# Patient Record
Sex: Male | Born: 1990 | Hispanic: No | Marital: Single | Smoking: Former smoker
Health system: Southern US, Community
[De-identification: ages and names within clinical notes are randomized; demographics above are authoritative.]

## PROBLEM LIST (undated history)

## (undated) DIAGNOSIS — A630 Anogenital (venereal) warts: Secondary | ICD-10-CM

## (undated) HISTORY — PX: APPENDECTOMY: SHX54

---

## 2012-08-19 ENCOUNTER — Encounter (HOSPITAL_COMMUNITY): Payer: Self-pay | Admitting: Emergency Medicine

## 2012-08-19 ENCOUNTER — Emergency Department (HOSPITAL_COMMUNITY): Payer: Managed Care, Other (non HMO)

## 2012-08-19 ENCOUNTER — Emergency Department (HOSPITAL_COMMUNITY)
Admission: EM | Admit: 2012-08-19 | Discharge: 2012-08-19 | Disposition: A | Discharge status: Home / Self Care | Payer: Managed Care, Other (non HMO) | Attending: Emergency Medicine | Admitting: Emergency Medicine

## 2012-08-19 DIAGNOSIS — S99911A Unspecified injury of right ankle, initial encounter: Secondary | ICD-10-CM

## 2012-08-19 DIAGNOSIS — S8990XA Unspecified injury of unspecified lower leg, initial encounter: Secondary | ICD-10-CM | POA: Insufficient documentation

## 2012-08-19 DIAGNOSIS — Y9367 Activity, basketball: Secondary | ICD-10-CM | POA: Insufficient documentation

## 2012-08-19 DIAGNOSIS — Y929 Unspecified place or not applicable: Secondary | ICD-10-CM | POA: Insufficient documentation

## 2012-08-19 DIAGNOSIS — X500XXA Overexertion from strenuous movement or load, initial encounter: Secondary | ICD-10-CM | POA: Insufficient documentation

## 2012-08-19 MED ORDER — HYDROCODONE-ACETAMINOPHEN 5-325 MG PO TABS: 1.0000 | ORAL_TABLET | ORAL | Status: DC | PRN

## 2012-08-19 NOTE — ED Provider Notes (Signed)
Medical screening examination/treatment/procedure(s) were performed by non-physician practitioner and as supervising physician I was immediately available for consultation/collaboration.  Donnetta Hutching, MD 08/19/12 [phone]

## 2012-08-19 NOTE — ED Notes (Signed)
Pt c/o r ankle injury. Reports twisting ankle last night while playing basketball. Swelling noted.

## 2012-08-19 NOTE — ED Provider Notes (Signed)
CSN: [id number]     Arrival date & time 08/19/12  1221 History     First MD Initiated Contact with Patient 08/19/12 1341     Chief Complaint  Patient presents with  . Ankle Injury   (Consider location/radiation/quality/duration/timing/severity/associated sxs/prior Treatment) HPI Dennis Bishop is a 21 y.o.male without any significant PMH presents to the ER with complaints of ankle injury. He twisted his ankle playing basketball last night and it continues to hurt. He denies being able to apply pressure on the foot because of pain. It hurts the most on the right side of his right ankle. He denies loss of consciousness or falling and injuring another part of his body. No head pain or neck pain. Knife history of injury to that ankle.    History reviewed. No pertinent past medical history. Past Surgical History  Procedure Laterality Date  . Appendectomy     No family history on file. History  Substance Use Topics  . Smoking status: Never Smoker   . Smokeless tobacco: Not on file  . Alcohol Use: No    Review of Systems ROS is negative unless otherwise stated in the HPI   Allergies  Review of patient's allergies indicates no known allergies.  Home Medications   Current Outpatient Rx  Name  Route  Sig  Dispense  Refill  . HYDROcodone-acetaminophen (NORCO/VICODIN) 5-325 MG per tablet   Oral   Take 1 tablet by mouth every 4 (four) hours as needed for pain.   10 tablet   0    BP 124/73  Pulse 97  Temp(Src) 98.8 F (37.1 C) (Oral)  Resp 14  SpO2 99% Physical Exam  Nursing note and vitals reviewed. Constitutional: He appears well-developed and well-nourished. No distress.  HENT:  Head: Normocephalic and atraumatic.  Eyes: Pupils are equal, round, and reactive to light.  Neck: Normal range of motion. Neck supple.  Cardiovascular: Normal rate and regular rhythm.   Pulmonary/Chest: Effort normal.  Abdominal: Soft.  Musculoskeletal:       Right ankle: He exhibits  decreased range of motion and swelling. He exhibits no ecchymosis, no deformity, no laceration and normal pulse. Tenderness. Lateral malleolus tenderness found. No medial malleolus tenderness found. Achilles tendon normal.  Neurological: He is alert.  Skin: Skin is warm and dry.    ED Course   Procedures (including critical care time)  Labs Reviewed - No data to display Dg Ankle Complete Right  08/19/2012   *RADIOLOGY REPORT*  Clinical Data: Pain post trauma  RIGHT ANKLE - COMPLETE 3+ VIEW  Comparison: None.  Findings: Frontal, oblique, and lateral views were obtained.  There is soft tissue swelling laterally. There is no demonstrable fracture.  There is a small joint effusion. Ankle mortise appears intact.  No erosive change.  IMPRESSION: Swelling laterally with small effusion.  Suspect ligamentous injury.  No fracture apparent.   Original Report Authenticated By: Bretta Bang, M.D.   1. Ankle injury, right, initial encounter     MDM  Is negative for bony injury. However effusion suggests possible ligamentous injury. She will be put and ASO and given crutches. I have advised him to stay off of his ankle for 2 weeks and do some rehabilitation exercises I have given them. If after one to 2 weeks he still unable to put any pressure on his foot and continues to have pain he's been referred to see the orthopedic doctor as he may have more severely damaged the ligament of suspected.  RICE protocol recommended.  21 y.o.Dennis Bishop's evaluation in the Emergency Department is complete. It has been determined that no acute conditions requiring further emergency intervention are present at this time. The patient/guardian have been advised of the diagnosis and plan. We have discussed signs and symptoms that warrant return to the ED, such as changes or worsening in symptoms.  Vital signs are stable at discharge. Filed Vitals:   08/19/12 1248  BP: 124/73  Pulse: 97  Temp: 98.8 F (37.1 C)   Resp: 14    Patient/guardian has voiced understanding and agreed to follow-up with the PCP or specialist.    Dorthula Matas, PA-C 08/19/12 1404

## 2013-05-28 ENCOUNTER — Encounter (HOSPITAL_COMMUNITY): Payer: Self-pay | Admitting: Emergency Medicine

## 2013-05-28 ENCOUNTER — Emergency Department (HOSPITAL_COMMUNITY)
Admission: EM | Admit: 2013-05-28 | Discharge: 2013-05-28 | Disposition: A | Discharge status: Home / Self Care | Payer: Managed Care, Other (non HMO) | Attending: Emergency Medicine | Admitting: Emergency Medicine

## 2013-05-28 DIAGNOSIS — Y9389 Activity, other specified: Secondary | ICD-10-CM | POA: Insufficient documentation

## 2013-05-28 DIAGNOSIS — W5501XA Bitten by cat, initial encounter: Secondary | ICD-10-CM

## 2013-05-28 DIAGNOSIS — IMO0002 Reserved for concepts with insufficient information to code with codable children: Secondary | ICD-10-CM | POA: Insufficient documentation

## 2013-05-28 DIAGNOSIS — Y929 Unspecified place or not applicable: Secondary | ICD-10-CM | POA: Insufficient documentation

## 2013-05-28 DIAGNOSIS — Z23 Encounter for immunization: Secondary | ICD-10-CM | POA: Insufficient documentation

## 2013-05-28 DIAGNOSIS — IMO0001 Reserved for inherently not codable concepts without codable children: Secondary | ICD-10-CM | POA: Insufficient documentation

## 2013-05-28 MED ORDER — AMOXICILLIN-POT CLAVULANATE 875-125 MG PO TABS: 1.0000 | ORAL_TABLET | Freq: Two times a day (BID) | ORAL | Status: DC

## 2013-05-28 MED ORDER — TETANUS-DIPHTH-ACELL PERTUSSIS 5-2.5-18.5 LF-MCG/0.5 IM SUSP
0.5000 mL | Freq: Once | INTRAMUSCULAR | Status: AC
  Administered 2013-05-28: 0.5 mL via INTRAMUSCULAR
  Filled 2013-05-28: qty 0.5

## 2013-05-28 NOTE — ED Notes (Signed)
Pt reports his new cat bite him on the Left index finger last night. No bleeding or redness noted. Denies Pain.

## 2013-05-28 NOTE — ED Provider Notes (Signed)
CSN: [id number]     Arrival date & time 05/28/13  1025 History   First MD Initiated Contact with Patient 05/28/13 1029     Chief Complaint  Patient presents with  . Animal Bite     (Consider location/radiation/quality/duration/timing/severity/associated sxs/prior Treatment) HPI Comments: Pt states that he got a new cat yesterday and while he was feeding her she bit the left index finger and scratched his right lower leg. Pt states that he thinks he finger looked a little swollen today. Pt is unsure of when his last tetanus was. Denies any drainage, redness or warmth to the area. Cat came from a breeder.   The history is provided by the patient. No language interpreter was used.    No past medical history on file. Past Surgical History  Procedure Laterality Date  . Appendectomy     No family history on file. History  Substance Use Topics  . Smoking status: Never Smoker   . Smokeless tobacco: Not on file  . Alcohol Use: No    Review of Systems  Constitutional: Negative.   Respiratory: Negative.   Cardiovascular: Negative.       Allergies  No known allergies  Home Medications   Prior to Admission medications   Not on File   There were no vitals taken for this visit. Physical Exam  Nursing note and vitals reviewed. Constitutional: He appears well-developed and well-nourished.  Cardiovascular: Normal rate and regular rhythm.   Pulmonary/Chest: Effort normal and breath sounds normal.  Neurological: He is alert. Coordination normal.  Skin:  Abrasion noted to the to the right lower leg. No sign of bite marks of infection noted to the left index finger. Pt has full rom. Cap refill <2 secs    ED Course  Procedures (including critical care time) Labs Review Labs Reviewed - No data to display  Imaging Review No results found.   EKG Interpretation None      MDM   Final diagnoses:  Cat bite    Tetanus updated. No infection noted. Will treat with  antibiotics    Teressa Lower, NP 05/28/13 1045

## 2013-05-28 NOTE — ED Provider Notes (Signed)
Medical screening examination/treatment/procedure(s) were performed by non-physician practitioner and as supervising physician I was immediately available for consultation/collaboration.  Roselind Klus L Presly Steinruck, MD 05/28/13 1440 

## 2013-05-28 NOTE — Discharge Instructions (Signed)

## 2013-09-08 ENCOUNTER — Encounter (HOSPITAL_COMMUNITY): Payer: Self-pay | Admitting: Emergency Medicine

## 2013-09-08 ENCOUNTER — Emergency Department (HOSPITAL_COMMUNITY)
Admission: EM | Admit: 2013-09-08 | Discharge: 2013-09-08 | Disposition: A | Discharge status: Home / Self Care | Payer: Managed Care, Other (non HMO) | Attending: Emergency Medicine | Admitting: Emergency Medicine

## 2013-09-08 ENCOUNTER — Emergency Department (HOSPITAL_COMMUNITY): Payer: Managed Care, Other (non HMO)

## 2013-09-08 DIAGNOSIS — Z792 Long term (current) use of antibiotics: Secondary | ICD-10-CM | POA: Diagnosis not present

## 2013-09-08 DIAGNOSIS — R0789 Other chest pain: Secondary | ICD-10-CM | POA: Diagnosis not present

## 2013-09-08 DIAGNOSIS — R079 Chest pain, unspecified: Secondary | ICD-10-CM | POA: Diagnosis present

## 2013-09-08 DIAGNOSIS — Z87891 Personal history of nicotine dependence: Secondary | ICD-10-CM | POA: Diagnosis not present

## 2013-09-08 LAB — BASIC METABOLIC PANEL
Anion gap: 14 (ref 5–15)
BUN: 14 mg/dL (ref 6–23)
CHLORIDE: 103 meq/L (ref 96–112)
CO2: 24 mEq/L (ref 19–32)
Calcium: 9.6 mg/dL (ref 8.4–10.5)
Creatinine, Ser: 1.06 mg/dL (ref 0.50–1.35)
GFR calc Af Amer: >90 mL/min (ref >90)
GFR calc non Af Amer: >90 mL/min (ref >90)
GLUCOSE: 68 mg/dL — AB (ref 70–99)
POTASSIUM: 3.7 meq/L (ref 3.7–5.3)
Sodium: 141 mEq/L (ref 137–147)

## 2013-09-08 LAB — CBC WITH DIFFERENTIAL/PLATELET
Basophils Absolute: 0 10*3/uL (ref 0.0–0.1)
Basophils Relative: 1 % (ref 0–1)
Eosinophils Absolute: 0.4 10*3/uL (ref 0.0–0.7)
Eosinophils Relative: 9 % — ABNORMAL HIGH (ref 0–5)
HCT: 42 % (ref 39.0–52.0)
Hemoglobin: 13.6 g/dL (ref 13.0–17.0)
LYMPHS ABS: 2.1 10*3/uL (ref 0.7–4.0)
Lymphocytes Relative: 48 % — ABNORMAL HIGH (ref 12–46)
MCH: 26.7 pg (ref 26.0–34.0)
MCHC: 32.4 g/dL (ref 30.0–36.0)
MCV: 82.5 fL (ref 78.0–100.0)
MONOS PCT: 12 % (ref 3–12)
Monocytes Absolute: 0.5 10*3/uL (ref 0.1–1.0)
NEUTROS ABS: 1.3 10*3/uL — AB (ref 1.7–7.7)
NEUTROS PCT: 30 % — AB (ref 43–77)
PLATELETS: 215 10*3/uL (ref 150–400)
RBC: 5.09 MIL/uL (ref 4.22–5.81)
RDW: 12.6 % (ref 11.5–15.5)
WBC: 4.3 10*3/uL (ref 4.0–10.5)

## 2013-09-08 LAB — SEDIMENTATION RATE: Sed Rate: 2 mm/hr (ref 0–16)

## 2013-09-08 LAB — TROPONIN I

## 2013-09-08 MED ORDER — GI COCKTAIL ~~LOC~~
30.0000 mL | Freq: Once | ORAL | Status: AC
  Administered 2013-09-08: 30 mL via ORAL
  Filled 2013-09-08: qty 30

## 2013-09-08 MED ORDER — RANITIDINE HCL 150 MG PO TABS: 150.0000 mg | ORAL_TABLET | Freq: Two times a day (BID) | ORAL | Status: DC

## 2013-09-08 NOTE — ED Notes (Signed)
Urinal given to pt per request.

## 2013-09-08 NOTE — ED Provider Notes (Signed)
CSN: [id number]     Arrival date & time 09/08/13  0745 History   First MD Initiated Contact with Patient 09/08/13 0750     Chief Complaint  Patient presents with  . Chest Pain     (Consider location/radiation/quality/duration/timing/severity/associated sxs/prior Treatment) Patient is a 23 y.o. male presenting with chest pain. The history is provided by the patient.  Chest Pain Pain location:  Substernal area Pain quality: sharp   Pain radiates to:  Does not radiate Pain radiates to the back: no   Pain severity:  Mild Onset quality:  Sudden Duration: up to 15 seconds. Timing:  Intermittent Progression since onset: becoming more frequent. Chronicity:  New Context: at rest   Relieved by: possibly by sitting up. Worsened by:  Nothing tried Ineffective treatments:  None tried Associated symptoms: no abdominal pain, no cough, no fever, no headache, no nausea, no numbness, no shortness of breath and not vomiting     History reviewed. No pertinent past medical history. Past Surgical History  Procedure Laterality Date  . Appendectomy     History reviewed. No pertinent family history. History  Substance Use Topics  . Smoking status: Former Games developer  . Smokeless tobacco: Not on file  . Alcohol Use: Yes     Comment: occ    Review of Systems  Constitutional: Negative for fever.  HENT: Negative for drooling and rhinorrhea.   Eyes: Negative for pain.  Respiratory: Negative for cough and shortness of breath.   Cardiovascular: Positive for chest pain. Negative for leg swelling.  Gastrointestinal: Negative for nausea, vomiting, abdominal pain and diarrhea.  Genitourinary: Negative for dysuria and hematuria.  Musculoskeletal: Negative for gait problem and neck pain.  Skin: Negative for color change.  Neurological: Negative for numbness and headaches.  Hematological: Negative for adenopathy.  Psychiatric/Behavioral: Negative for behavioral problems.  All other systems reviewed and  are negative.     Allergies  Review of patient's allergies indicates no known allergies.  Home Medications   Prior to Admission medications   Medication Sig Start Date End Date Taking? Authorizing Provider  amoxicillin-clavulanate (AUGMENTIN) 875-125 MG per tablet Take 1 tablet by mouth every 12 (twelve) hours. 05/28/13   Teressa Lower, NP   BP 144/69  Pulse 63  Temp(Src) 97.6 F (36.4 C) (Oral)  Resp 22  SpO2 100% Physical Exam  Nursing note and vitals reviewed. Constitutional: He is oriented to person, place, and time. He appears well-developed and well-nourished.  HENT:  Head: Normocephalic and atraumatic.  Right Ear: External ear normal.  Left Ear: External ear normal.  Nose: Nose normal.  Mouth/Throat: Oropharynx is clear and moist. No oropharyngeal exudate.  Eyes: Conjunctivae and EOM are normal. Pupils are equal, round, and reactive to light.  Neck: Normal range of motion. Neck supple.  Cardiovascular: Normal rate, regular rhythm, normal heart sounds and intact distal pulses.  Exam reveals no gallop and no friction rub.   No murmur heard. No friction rub heard.   Pulmonary/Chest: Effort normal and breath sounds normal. No respiratory distress. He has no wheezes.  Abdominal: Soft. Bowel sounds are normal. He exhibits no distension. There is no tenderness. There is no rebound and no guarding.  Musculoskeletal: Normal range of motion. He exhibits no edema and no tenderness.  Neurological: He is alert and oriented to person, place, and time.  Skin: Skin is warm and dry.  Psychiatric: He has a normal mood and affect. His behavior is normal.    ED Course  Procedures (including critical care  time) Labs Review Labs Reviewed  CBC WITH DIFFERENTIAL - Abnormal; Notable for the following:    Neutrophils Relative % 30 (*)    Neutro Abs 1.3 (*)    Lymphocytes Relative 48 (*)    Eosinophils Relative 9 (*)    All other components within normal limits  BASIC METABOLIC  PANEL - Abnormal; Notable for the following:    Glucose, Bld 68 (*)    All other components within normal limits  SEDIMENTATION RATE  TROPONIN I    Imaging Review Dg Chest 2 View  09/08/2013   CLINICAL DATA:  Chest pain for 1 week.  EXAM: CHEST  2 VIEW  COMPARISON:  None.  FINDINGS: The heart size and mediastinal contours are within normal limits. Both lungs are clear. The visualized skeletal structures are unremarkable.  IMPRESSION: No active cardiopulmonary disease.   Electronically Signed   By: Richarda Overlie M.D.   On: 09/08/2013 08:40     EKG Interpretation   Date/Time:  Wednesday September 08 2013 07:53:31 EDT Ventricular Rate:  64 PR Interval:  138 QRS Duration: 102 QT Interval:  408 QTC Calculation: 421 R Axis:   80 Text Interpretation:  Sinus or ectopic atrial rhythm ST elevation suggests  acute pericarditis no previous for comparison Confirmed by Destina Mantei  MD,  Vickee Mormino ([id number]) on 09/08/2013 8:26:16 AM      MDM   Final diagnoses:  Other chest pain    8:01 AM 23 y.o. male who presents with intermittent sharp chest pain becoming more frequent over the last week. He denies any pain currently on exam and denies any shortness of breath or pleuritic-type pain. He believes his pain is worse in the morning and after drinking liquids in the morning. He notes that it is not exertional. He believes it may be relieved to some degree by sitting up. He is afebrile and vital signs are unremarkable here. He denies any fevers or other associated symptoms at home. His EKG shows some mild diffuse ST elevation which is slightly suspicious for pericarditis. He has no friction rub on exam. I performed a bedside ultrasound which showed only very mild trace pericardial effusion which is likely physiologic. He is otherwise well-appearing on exam. Will get screening labs and imaging. Differential also includes esophageal reflux. Will give a GI cocktail.  10:01 AM: I interpreted/reviewed the labs and/or  imaging which were non-contributory.  Pt continues to appear well. Doubt pericarditis. Pt notes sx worse after eating a spicy meal recently. Likely reflux. Will have him return for any worsening. I have discussed the diagnosis/risks/treatment options with the patient and believe the pt to be eligible for discharge home to follow-up with and estab w/ a pcp as needed. We also discussed returning to the ED immediately if new or worsening sx occur. We discussed the sx which are most concerning (e.g., worsening pain, fever, sob) that necessitate immediate return. Medications administered to the patient during their visit and any new prescriptions provided to the patient are listed below.  Medications given during this visit Medications  gi cocktail (Maalox,Lidocaine,Donnatal) (30 mLs Oral Given 09/08/13 0821)    New Prescriptions   RANITIDINE (ZANTAC) 150 MG TABLET    Take 1 tablet (150 mg total) by mouth 2 (two) times daily.       Purvis Sheffield, MD 09/08/13 1003

## 2013-09-08 NOTE — ED Notes (Addendum)
Pt c/o increasing, intermittent central chest pain x 1 week.  Pain score 2/10.  Pt denies other symptoms.  Sts pain increases w/ "drinking water."   Hx of smoking.  NAD noted.

## 2013-11-04 ENCOUNTER — Emergency Department (HOSPITAL_COMMUNITY)
Admission: EM | Admit: 2013-11-04 | Discharge: 2013-11-04 | Disposition: A | Discharge status: Home / Self Care | Payer: Managed Care, Other (non HMO) | Attending: Emergency Medicine | Admitting: Emergency Medicine

## 2013-11-04 ENCOUNTER — Encounter (HOSPITAL_COMMUNITY): Payer: Self-pay | Admitting: Emergency Medicine

## 2013-11-04 DIAGNOSIS — Z87891 Personal history of nicotine dependence: Secondary | ICD-10-CM | POA: Diagnosis not present

## 2013-11-04 DIAGNOSIS — R05 Cough: Secondary | ICD-10-CM | POA: Diagnosis not present

## 2013-11-04 DIAGNOSIS — R350 Frequency of micturition: Secondary | ICD-10-CM | POA: Insufficient documentation

## 2013-11-04 DIAGNOSIS — R21 Rash and other nonspecific skin eruption: Secondary | ICD-10-CM | POA: Diagnosis not present

## 2013-11-04 DIAGNOSIS — Z202 Contact with and (suspected) exposure to infections with a predominantly sexual mode of transmission: Secondary | ICD-10-CM | POA: Diagnosis present

## 2013-11-04 DIAGNOSIS — J3489 Other specified disorders of nose and nasal sinuses: Secondary | ICD-10-CM | POA: Diagnosis not present

## 2013-11-04 NOTE — ED Provider Notes (Signed)
CSN: [id number]     Arrival date & time 11/04/13  1547 History  This chart was scribed for non-physician practitioner, Wynetta EmeryNicole Bela Nyborg, PA, working with No att. providers found by Marica OtterNusrat Rahman, ED Scribe. This patient was seen in room WTR6/WTR6 and the patient's care was started at [facility]6:42 PM.    Chief Complaint  Patient presents with  . SEXUALLY TRANSMITTED DISEASE   The history is provided by the patient. No language interpreter was used.   HPI Comments: PCP: No PCP Per Patient  Othman Jerolyn Shinlkhamees is a 23 y.o. male, with medical hx noted below, who presents to the Emergency Department with suspected contraction of an STD. Patient complains of white spots on his testicles and shaft; and frequency. Pt denies any penile pain or discharge. Pt reports his last sexual encounter was three weeks ago with a male and reports using protection. Pt denies fever, abd pain, dsyuria. Pt notes, however, that he is recovering from a flu and has mild rhinorrhea and cough.   History reviewed. No pertinent past medical history. Past Surgical History  Procedure Laterality Date  . Appendectomy     History reviewed. No pertinent family history. History  Substance Use Topics  . Smoking status: Former Smoker -- 1.00 packs/day for 5 years    Types: Cigarettes  . Smokeless tobacco: Not on file  . Alcohol Use: Yes     Comment: occ    Review of Systems  Constitutional: Negative for fever and chills.  HENT: Positive for rhinorrhea.   Respiratory: Positive for cough.   Genitourinary: Positive for frequency. Negative for dysuria, discharge, difficulty urinating, penile pain and testicular pain.  Skin: Positive for color change (white spots on testicles. ).  Psychiatric/Behavioral: Negative for confusion.  All other systems reviewed and are negative.  Allergies  Review of patient's allergies indicates no known allergies.  Home Medications   Prior to Admission medications   Not on File   Triage Vitals:  BP 145/73  Pulse 93  Temp(Src) 97.8 F (36.6 C) (Oral)  Resp 16  SpO2 100%  Physical Exam  Nursing note and vitals reviewed. Constitutional: He is oriented to person, place, and time. He appears well-developed and well-nourished. No distress.  HENT:  Head: Normocephalic and atraumatic.  Eyes: EOM are normal.  Neck: Neck supple. No tracheal deviation present.  Cardiovascular: Normal rate.   Pulmonary/Chest: Effort normal. No respiratory distress.  Genitourinary: Penis normal. No penile tenderness.  Milia like rash along the dorsal side of the penile shaft. No erythema, tenderness or discharge.   Musculoskeletal: Normal range of motion.  Neurological: He is alert and oriented to person, place, and time.  Skin: Skin is warm and dry.  Psychiatric: He has a normal mood and affect. His behavior is normal.    ED Course  Procedures (including critical care time) DIAGNOSTIC STUDIES: Oxygen Saturation is 100% on RA, nl by my interpretation.    COORDINATION OF CARE: 6:47 PM- Discussed treatment plan which includes dermatology referral with patient at bedside and patient agreed to plan.   Labs Review Labs Reviewed - No data to display  Imaging Review No results found.   EKG Interpretation None      MDM   Final diagnoses:  Rash and nonspecific skin eruption    Filed Vitals:   11/04/13 1558 11/04/13 1909  BP: 145/73 138/71  Pulse: 93 64  Temp: 97.8 F (36.6 C) 97.8 F (36.6 C)  TempSrc: Oral   Resp: 16 16  SpO2: 100% 100%  Shreyan Jerolyn Shinlkhamees is a 23 y.o. male presenting with rash to penile shaft, non-concerning for syphilis, herpes. No systemic signs of infection. I have reassured the patient and offered STD testing which patient declines. We'll give him a referral to dermatology.  Evaluation does not show pathology that would require ongoing emergent intervention or inpatient treatment. Pt is hemodynamically stable and mentating appropriately. Discussed findings and  plan with patient/guardian, who agrees with care plan. All questions answered. Return precautions discussed and outpatient follow up given.    I personally performed the services described in this documentation, which was scribed in my presence. The recorded information has been reviewed and is accurate.    Wynetta Emeryicole Briannah Lona, PA-C 11/04/13 1942

## 2013-11-04 NOTE — Discharge Instructions (Signed)
Do not hesitate to return to the emergency room for any new, worsening or concerning symptoms. ° °Please obtain primary care using resource guide below. But the minute you were seen in the emergency room and that they will need to obtain records for further outpatient management. ° ° ° °Emergency Department Resource Guide °1) Find a Doctor and Pay Out of Pocket °Although you won't have to find out who is covered by your insurance plan, it is a good idea to ask around and get recommendations. You will then need to call the office and see if the doctor you have chosen will accept you as a new patient and what types of options they offer for patients who are self-pay. Some doctors offer discounts or will set up payment plans for their patients who do not have insurance, but you will need to ask so you aren't surprised when you get to your appointment. ° °2) Contact Your Local Health Department °Not all health departments have doctors that can see patients for sick visits, but many do, so it is worth a call to see if yours does. If you don't know where your local health department is, you can check in your phone book. The CDC also has a tool to help you locate your state's health department, and many state websites also have listings of all of their local health departments. ° °3) Find a Walk-in Clinic °If your illness is not likely to be very severe or complicated, you may want to try a walk in clinic. These are popping up all over the country in pharmacies, drugstores, and shopping centers. They're usually staffed by nurse practitioners or physician assistants that have been trained to treat common illnesses and complaints. They're usually fairly quick and inexpensive. However, if you have serious medical issues or chronic medical problems, these are probably not your best option. ° °No Primary Care Doctor: °- Call [facility] at  832-8000 - they can help you locate a primary care doctor that  accepts your  insurance, provides certain services, etc. °- Physician Referral Service- 1-800-533-3463 ° °Chronic Pain Problems: °Organization         Address  Phone   Notes  °[facility]  (336) 297-2271 Patients need to be referred by their primary care doctor.  ° °Medication Assistance: °Organization         Address  Phone   Notes  °[place] [facility] [address] °[place], [place] [address] (336) 641-8030 --Must be a resident of [place] °-- Must have NO insurance coverage whatsoever (no Medicaid/ Medicare, etc.) °-- The pt. MUST have a primary care doctor that directs their care regularly and follows them in the community °  °[facility]  (866) 331-1348   °[employer]  (888) 892-1162   ° °Agencies that provide inexpensive medical care: °Organization         Address  Phone   Notes  °[facility]  (336) 832-8035   °[facility]    (336) 832-7272   °[facility] [address] °[place], [place] [address] (336) 832-4777   °[facility] [address], °[place] (336) 271-4999   °[facility]    (336) 373-0678   °[facility]    (336) 272-1050   °[facility] ° [address], [place] Phone:  (336) 832-4444, Fax:  (336) 832-4440 Hours of Operation:  9 am -   6 pm, M-F.  Also accepts Medicaid/Medicare and self-pay.  °[facility] ° [address], [place] Phone: (336) 832-3150, Fax: (336) 832-3151. Hours of Operation:  8:30 am - 5:30 pm, M-F.  Also accepts Medicaid and self-pay.  °[facility] [place] [address], [place] Phone: (336) 878-6027   °[facility] [address], [place], [place] (336)723-1848, Ext. 123 Mondays & Thursdays: 7-9 AM.  First 15 patients are seen on a first come, first serve basis. °  ° °Medicaid-accepting [place] Providers: ° °Organization          Address  Phone   Notes  °[facility] [address], [place] (336) 641-2100 Also accepts self-pay patients.  °[facility] [address], [place] ° (336) 856-9996   °[facility] [address], [place] (336) 288-8857   °[facility] [address] [place] [address], [place] (336) 299-7000   °Veita Bland [address], [place]  ° (336) 373-1557 Only accepts [place] Access Medicaid patients after they have their name applied to their card.  ° °Self-Pay (no insurance) in [place]: ° °Organization         Address  Phone   Notes  °Sickle Cell Patients, [facility] [address], [place] (336) 832-1970   °[facility] [address], [place] (336) 832-4400   °[facility] ° [address], [place] (336) 992-4800   °[facility]/Dr. Osei-Bonsu ° [address] [place] [address], [place] or [address], [place] (336) 841-8500 Phone number for both [place] and [place] locations is the same.  °[facility] [address], [place] (336) 299-0000   °[facility] [address] [place] [address], [place] or [address] (336) 852-7530 °(336) 878-2260   °[facility] [address], [place] (336) 350-1642, phone; (336) 294-5005, fax Sees patients 1st and 3rd Saturday of every month.  Must not qualify for public or private insurance (i.e. Medicaid, Medicare, [place] Health Choice, Veterans' Benefits) • Household income should be no more than 200% of the poverty level •The clinic cannot treat you if you are pregnant or think you are pregnant • Sexually transmitted diseases are not treated at the clinic.  ° ° °Dental Care: °Organization         Address  Phone  Notes  °[facility] [address],   (336) 641-6152 Accepts children up to age 21 who are enrolled in Medicaid or [place] Health Choice; pregnant women with a Medicaid card; and children who have applied for Medicaid or [place] Health Choice, but were declined, whose parents can pay a reduced fee at time of service.  °[facility] [place]  [address], [place] (336) 641-7733 Accepts children up to age 21 who are enrolled in Medicaid or [place] Health Choice; pregnant women with a Medicaid card; and children who have applied for Medicaid or  Health Choice, but were declined, whose parents can pay a reduced fee at time of service.  °[facility] ° [address],  (336) 641-4533 Patients are seen by appointment only. Walk-ins are not accepted. [facility] will see patients 18 years of age and   older. °Monday - Tuesday (8am-5pm) °Most Wednesdays (8:30-5pm) °$30 per visit, cash only  °[facility] ° [address], [place] (336) 641-4533 Patients are seen by appointment only. Walk-ins are not accepted. [facility] will see patients 18 years of age and older. °One Wednesday Evening (Monthly: Volunteer Based).  $30 per visit, cash only  °[facility]  (919) 537-3737 for adults; Children under age 4, call Graduate Pediatric Dentistry at (919) [phone]. Children aged 4-14, please call (919) 537-3737 to request a pediatric application. ° Dental services are provided in all areas of dental care including fillings, crowns and bridges, complete and partial dentures, implants, gum treatment, root canals, and extractions. Preventive care is also provided. Treatment is provided to both adults and children. °Patients are selected via a lottery and there is often a waiting list. °  °[facility] [address], °[place] ° (336) 763-8833 [email or web address] °  °[facility] [address], [place], [place]  (336)723-1848, Ext. 123 Second and Fourth Thursday of each month, opens at 6:30 AM; Clinic ends at 9 AM.  Patients are seen on a first-come first-served basis, and a limited number are seen during each clinic.  ° °[facility] ° [address], [place], [place] (336) 723-7904   Eligibility Requirements °You must have lived in [place], [place], or [place] counties for at least the last three months. °  You cannot be eligible for state or federal sponsored healthcare insurance, including Veterans Administration, Medicaid, or Medicare. °  You generally cannot be eligible for healthcare insurance through your employer.  °  How to apply: °Eligibility screenings are held every Tuesday and Wednesday afternoon from 1:00 pm until 4:00 pm. You do not need an appointment for the interview!  °[facility] [address], [place], [place] 336-631-2330   °[facility]  336-342-8273   °[facility]  336-703-3100   °[facility]  336-570-6415   ° °Behavioral Health Resources in the Community: °Intensive Outpatient Programs °Organization         Address  Phone  Notes  °[facility] [address], [place], [place] 336-878-6098   °[facility] [address], [place], [place] 336-832-9800   °[facility] [address], [place], [place] ° 336-882-2125   °[facility] [address],  °[place], [place] 1-800-853-5163 or 336-641-4981   °Substance Abuse Resources °Organization         Address  Phone  Notes  °[facility]  336-882-2125   °[facility]  336-784-9470   °[facility]  336-285-9073   °[facility]  336-845-3988   °Residential & Outpatient Substance Abuse Program  1-800-659-3381   °Psychological Services °Organization         Address  Phone  Notes  °[facility]  336- 832-9600   °[facility]  336- 378-7881    °[facility] [address], [place] 1-800-853-5163 or 336-641-4981   ° °Mobile Crisis Teams °Organization         Address  Phone  Notes  °[facility], Mobile Crisis Care Unit  1-877-626-1772   °Assertive °[facility] ° [address] [place], [place] 336-834-9664   °Sharon DeEsch [address] °[place] 336-554-5454   ° °Self-Help/Support Groups °Organization         Address    Phone             Notes  °[facility] - variety of support groups  336- 373-1402 Call for more information  °Narcotics Anonymous (NA), [facility] [address], °[place]  2 meetings at this location  ° °Residential Treatment Programs °Organization         Address  Phone  Notes  °[facility] [address],    °[place]  1-866-801-8205   °[facility] ° [address], [place], [place] 704-293-8524   °[facility] [address], [place] 336-845-3988 Admissions: 8am-3pm M-F  °[facility] [address]    °[place], [place] 336-841-1104   °[facility] [address], [place], [place] 336-379-7146   °[facility] [address]  °[place], [place] 336-285-9073   °[facility] Intensive Outpatient [address], [place], [place] 336-852-3033   °[facility] [address]  °[place], [place] 1-877-615-2722 or 336-784-9470   °[facility] [address] [place], [place] 336-227-7417 Accepts Medicaid  °[facility] [address]  °[place] 1-800-659-3381 Substance Abuse/Addiction Treatment  ° °[place] Behavioral Health Resources °Organization         Address  Phone  Notes  °[facility]  (888) 581-9988   °Julie Brannon, PhD [address] [place], [place]   (336) 349-5553 or (336) 951-0000   °[facility]   [address] °[place], [place] (336) 349-4454   °[facility] [address], [place], [place] (336) 342-8316 Insurance/Medicaid/sponsorship through [facility]  °[facility] [address]                                    [place], [place] (336) 342-8316 Therapy/tele-psych/case  °[facility] [address]  ° [place], [place] (336) 349-2233    °Dr. Arfeen  (336) 349-4544   °[facility] [place]  [employer] [place] [facility] 1) [address], [place] °2) [address], [place] °3)  [address], [place] (336) 349-3220 °(336) 342-7768 ° °(336) 342-8140   °[place] [facility] (336) 342-1394 or (336) 342-3537 (After Hours)    ° ° ° °

## 2013-11-04 NOTE — ED Notes (Signed)
Pt reports bumps on his penis and white spots on his testicles x4 days. Denies penile discharge. Denies pain.

## 2013-11-06 NOTE — ED Provider Notes (Signed)
Medical screening examination/treatment/procedure(s) were performed by non-physician practitioner and as supervising physician I was immediately available for consultation/collaboration.   EKG Interpretation None        Dennis SkeensJoshua M Marigny Borre, MD 11/06/13 1121

## 2013-11-21 ENCOUNTER — Encounter (HOSPITAL_COMMUNITY): Payer: Self-pay

## 2013-11-21 ENCOUNTER — Emergency Department (HOSPITAL_COMMUNITY)
Admission: EM | Admit: 2013-11-21 | Discharge: 2013-11-21 | Disposition: A | Discharge status: Home / Self Care | Payer: Managed Care, Other (non HMO) | Attending: Emergency Medicine | Admitting: Emergency Medicine

## 2013-11-21 DIAGNOSIS — Z87891 Personal history of nicotine dependence: Secondary | ICD-10-CM | POA: Diagnosis not present

## 2013-11-21 DIAGNOSIS — R21 Rash and other nonspecific skin eruption: Secondary | ICD-10-CM | POA: Insufficient documentation

## 2013-11-21 NOTE — ED Provider Notes (Signed)
CSN: [id number]     Arrival date & time 11/21/13  [id number] History   First MD Initiated Contact with Patient 11/21/13 [phone]     Chief Complaint  Patient presents with  . Rash     (Consider location/radiation/quality/duration/timing/severity/associated sxs/prior Treatment) HPI Dennis Bishop is a 23 year old malewith no past medical history who presents to the ER with a rash in his genitals. Patient states he was seen here several weeks ago, and the provider noted that he had a rash on his scrotum which appeared benign. Patient states since then he has now noticed the same type of rash on his shaft and head of his penis. Patient reports the lesions are asymptomatic, do not itch, burn and are nonpainful. Patient denies penile discharge, dysuria, abdominal pain. Patient states his last sexual contact was approximately one month ago, and he has not had any sexual contact since then.  History reviewed. No pertinent past medical history. Past Surgical History  Procedure Laterality Date  . Appendectomy     No family history on file. History  Substance Use Topics  . Smoking status: Former Smoker -- 1.00 packs/day for 5 years    Types: Cigarettes  . Smokeless tobacco: Not on file  . Alcohol Use: Yes     Comment: occ    Review of Systems  Constitutional: Negative for fever and chills.  Skin: Positive for rash.      Allergies  Review of patient's allergies indicates no known allergies.  Home Medications   Prior to Admission medications   Not on File   BP 132/47 mmHg  Pulse 59  Temp(Src) 97.8 F (36.6 C) (Oral)  Resp 16  SpO2 100% Physical Exam  Constitutional: He is oriented to person, place, and time. He appears well-developed and well-nourished. No distress.  HENT:  Head: Normocephalic and atraumatic.  Eyes: Right eye exhibits no discharge. Left eye exhibits no discharge. No scleral icterus.  Neck: Normal range of motion.  Pulmonary/Chest: Effort normal. No respiratory  distress.  Genitourinary: Testes normal. Cremasteric reflex is present. Right testis shows no mass, no swelling and no tenderness. Left testis shows no mass, no swelling and no tenderness. Circumcised. No penile erythema or penile tenderness. No discharge found.  Diffuse maculopapular, skin tone, flat, well-circumscribed lesions noted diffusely patient's scrotum, shaft of his penis, and head of his penis. No penile discharge, tenderness. No open sores or vesicular lesions. No chancre.   Musculoskeletal: Normal range of motion.  Neurological: He is alert and oriented to person, place, and time.  Skin: Skin is warm and dry. He is not diaphoretic.  Psychiatric: He has a normal mood and affect.  Nursing note and vitals reviewed.   ED Course  Procedures (including critical care time) Labs Review Labs Reviewed - No data to display  Imaging Review No results found.   EKG Interpretation None      MDM   Final diagnoses:  Rash and nonspecific skin eruption    Painless, asymptomatic multiple maculopapular skin toned bumps noted on exam consistent with possible genital warts. Patient was encouraged to follow with dermatology last time,which he states he was unable to follow-up with them. Since the rash has progressed, I strongly encouraged patient to follow-up this time with dermatology. I offered further STD testing including GC/chlamydia, HIV, syphilis which patient denied today. I discussed return precautions with patient, and encourage him to call or return to the ER should he have any questions or concerns.  BP 132/47 mmHg  Pulse 59  Temp(Src) 97.8 F (36.6 C) (Oral)  Resp 16  SpO2 100%  Signed,  Ladona MowJoe Braidyn Peace, PA-C 11:05 AM  Patient discussed with Dr. Rochele RaringKristen Ward, M.D.  Monte FantasiaJoseph W Alyxis Grippi, PA-C 11/21/13 1105  Layla MawKristen N Ward, DO 11/21/13 [phone]

## 2013-11-21 NOTE — Discharge Instructions (Signed)
Follow-up with dermatology. Return to the ER if your symptoms worsen.   Genital Warts Genital warts are a sexually transmitted infection. They may appear as small bumps on the tissues of the genital area. CAUSES  Genital warts are caused by a virus called human papillomavirus (HPV). HPV is the most common sexually transmitted disease (STD) and infection of the sex organs. This infection is spread by having unprotected sex with an infected person. It can be spread by vaginal, anal, and oral sex. Many people do not know they are infected. They may be infected for years without problems. However, even if they do not have problems, they can unknowingly pass the infection to their sexual partners. SYMPTOMS   Itching and irritation in the genital area.  Warts that bleed.  Painful sexual intercourse. DIAGNOSIS  Warts are usually recognized with the naked eye on the vagina, vulva, perineum, anus, and rectum. Certain tests can also diagnose genital warts, such as:  A Pap test.  A tissue sample (biopsy) exam.  Colposcopy. A magnifying tool is used to examine the vagina and cervix. The HPV cells will change color when certain solutions are used. TREATMENT  Warts can be removed by:  Applying certain chemicals, such as cantharidin or podophyllin.  Liquid nitrogen freezing (cryotherapy).  Immunotherapy with Candida or Trichophyton injections.  Laser treatment.  Burning with an electrified probe (electrocautery).  Interferon injections.  Surgery. PREVENTION  HPV vaccination can help prevent HPV infections that cause genital warts and that cause cancer of the cervix. It is recommended that the vaccination be given to people between the ages 25 to 36 years old. The vaccine might not work as well or might not work at all if you already have HPV. It should not be given to pregnant women. HOME CARE INSTRUCTIONS   It is important to follow your caregiver's instructions. The warts will not go away  without treatment. Repeat treatments are often needed to get rid of warts. Even after it appears that the warts are gone, the normal tissue underneath often remains infected.  Do not try to treat genital warts with medicine used to treat hand warts. This type of medicine is strong and can burn the skin in the genital area, causing more damage.  Tell your past and current sexual partner(s) that you have genital warts. They may be infected also and need treatment.  Avoid sexual contact while being treated.  Do not touch or scratch the warts. The infection may spread to other parts of your body.  Women with genital warts should have a cervical cancer check (Pap test) at least once a year. This type of cancer is slow-growing and can be cured if found early. Chances of developing cervical cancer are increased with HPV.  Inform your obstetrician about your warts in the event of pregnancy. This virus can be passed to the baby's respiratory tract. Discuss this with your caregiver.  Use a condom during sexual intercourse. Following treatment, the use of condoms will help prevent reinfection.  Ask your caregiver about using over-the-counter anti-itch creams. SEEK MEDICAL CARE IF:   Your treated skin becomes red, swollen, or painful.  You have a fever.  You feel generally ill.  You feel little lumps in and around your genital area.  You are bleeding or have painful sexual intercourse. MAKE SURE YOU:   Understand these instructions.  Will watch your condition.  Will get help right away if you are not doing well or get worse. Document Released: 12/22/1999  Document Revised: 05/10/2013 Document Reviewed: 07/02/2010 [facility] Patient Information 2015 [place], [place]. This information is not intended to replace advice given to you by your health care provider. Make sure you discuss any questions you have with your health care provider.   Emergency Department Resource Guide 1) Find a Doctor and  Pay Out of Pocket Although you won't have to find out who is covered by your insurance plan, it is a good idea to ask around and get recommendations. You will then need to call the office and see if the doctor you have chosen will accept you as a new patient and what types of options they offer for patients who are self-pay. Some doctors offer discounts or will set up payment plans for their patients who do not have insurance, but you will need to ask so you aren't surprised when you get to your appointment.  2) Contact Your Local Health Department Not all health departments have doctors that can see patients for sick visits, but many do, so it is worth a call to see if yours does. If you don't know where your local health department is, you can check in your phone book. The CDC also has a tool to help you locate your state's health department, and many state websites also have listings of all of their local health departments.  3) Find a Walk-in Clinic If your illness is not likely to be very severe or complicated, you may want to try a walk in clinic. These are popping up all over the country in pharmacies, drugstores, and shopping centers. They're usually staffed by nurse practitioners or physician assistants that have been trained to treat common illnesses and complaints. They're usually fairly quick and inexpensive. However, if you have serious medical issues or chronic medical problems, these are probably not your best option.  No Primary Care Doctor: - Call Health Connect at  [phone] - they can help you locate a primary care doctor that  accepts your insurance, provides certain services, etc. - Physician Referral Service- [phone]  Chronic Pain Problems: Organization         Address  Phone   Notes  [facility]  [phone] Patients need to be referred by their primary care doctor.   Medication Assistance: Organization         Address  Phone   Notes  [facility] Medication [facility] [address] [place], [place] [address] [phone] --Must be a resident of [facility] -- Must have NO insurance coverage whatsoever (no Medicaid/ Medicare, etc.) -- The pt. MUST have a primary care doctor that directs their care regularly and follows them in the community   MedAssist  [phone]   [employer]  [phone]    Agencies that provide inexpensive medical care: Organization         Address  Phone   Notes  [facility]  [phone]   [facility]    [phone]) [phone]   [facility] [address] [place], [place] [address] [phone]   [facility] [address], [place] [phone]) [phone]   [facility]anned [facility]    [phone]) [phone]   [facility]    [phone]) [phone]   [facility]  [address], [place] Phone:  [phone], Fax:  [phone] Hours of Operation:  9 am - 6 pm, M-F.  Also accepts  Medicaid/Medicare and self-pay.  [facility]  [address], [place] Phone: [phone], Fax: [phone]. Hours of Operation:  8:30 am - 5:30 pm, M-F.  Also accepts Medicaid and self-pay.  [facility] [place] [address] [place] Phone: [phone]   [facility] [address] Natasha Bence [place], [place] [phone], Ext. [phone] Mondays & Thursdays: 7-9 AM.  First 15 patients are seen on a first come, first serve basis.    Medicaid-accepting [facility] Providers:  Organization         Address  Phone   Notes  [facility] [address], [place] [phone] Also accepts self-pay patients.  [facility] [address] Laurell Josephs [place], [place]  [phone]   [facility] [address], [place] [phone]   [facility] [address], [place] [phone]   Renaye Rakers [address], [place]   [phone] Only accepts [place] patients after they have their name applied to their card.   Self-Pay (no insurance) in [facility]:  Organization         Address  Phone   Notes  Sickle Cell Patients, [facility] [address] [place], [place] [phone]   [facility] [address] [place], [place] [phone]   [facility] [place]  [address], [place] [phone]   [facility]/Dr. Osei-Bonsu  [address] [place] or [address], [place] [phone] Phone number for both [place] and [place] locations is the same.  [facility] and [facility] [address], [place] [phone]   [facility] [address], [place] or [address] [phone]   [facility] [address], [place] [phone], phone; [phone], fax Sees patients 1st and 3rd Saturday of every month.  Must not qualify for public or private insurance (i.e. Medicaid, Medicare, [place] Health Choice, Veterans' Benefits)  Household income should be no more than 200% of the poverty level The clinic cannot treat you if you are pregnant or think you are pregnant  Sexually transmitted diseases are not treated at the clinic.    Dental Care: Organization         Address  Phone  Notes  [facility] of [facility] [address] [place], [place] [phone] Accepts children up to age 51 who are enrolled in [place] or [place] Health Choice; pregnant women with a Medicaid card; and children who have applied for Medicaid or [place] Health Choice, but were declined, whose parents can pay a reduced fee at time of service.  [facility] Department of [facility]  [address], [place] [phone] Accepts children up to age 10 who are enrolled in [place] or [place] Health Choice; pregnant women with a Medicaid card; and children who have applied for Medicaid or  Health Choice, but were declined, whose parents can pay a reduced fee at time of service.  [facility]  [address] [place], [place] [phone] Patients are seen by appointment only. Walk-ins are not accepted. [facility] will see patients 71 years of age and older. Monday - Tuesday (8am-5pm) Most  Wednesdays (8:30-5pm) $30 per visit, cash only  [facility]  [address], [facility] [phone] Patients are seen by appointment only. Walk-ins are not accepted. [facility] will see patients 21 years of age and older. One Wednesday Evening (Monthly: Volunteer Based).  $30 per visit, cash only  [facility]  [phone] for adults; Children under age 274, call Graduate Pediatric Dentistry at [phone]. Children aged 644-14, please call [phone] to request a pediatric application.  Dental services are provided in all areas of dental care including fillings, crowns and bridges, complete and partial dentures, implants, gum treatment, root canals, and extractions. Preventive care is also provided. Treatment is provided to both adults and children. Patients are selected via a lottery and there is often a waiting list.   [facility] [address], [place]  [phone]) [phone] [email or web address]   [facility] [address], [place], [place] [phone], Ext. [phone] Second and Fourth Thursday of each month, opens at 6:30 AM; Clinic ends at 9 AM.  Patients are seen on a first-come first-served basis, and a limited number are seen during each clinic.   [facility]  [address], [place], [place] [phone]) [phone]   Eligibility Requirements You must have lived in [place], [place], or  [place] counties for at least the last three months.   You cannot be eligible for state or federal sponsored National Cityhealthcare insurance, including CIGNAVeterans Administration, [place]icaid, or [employer] EntertainmentMedicare.   You generally cannot be eligible for healthcare insurance through your employer.    How to apply: Eligibility screenings are held every Tuesday and Wednesday afternoon from 1:00 pm until 4:00 pm. You do not need an appointment for the interview!  [facility] [address], [place], [place] [phone]   [facility]  [phone]   [facility]  [phone]   [facility]  [phone]    Behavioral Health Resources in the Community: Intensive Outpatient Programs Organization         Address  Phone  Notes  [facility] [address], [place], [place] [phone]   [facility] Outpatient [address], [place], [place] [phone]   [facility] [address], [place], [place]  [phone]   [facility] [address],  [place], [place] [phone] or [phone]   Substance Abuse Resources Organization         Address  Phone  Notes  [facility]  [phone]   [facility]  [phone]   [facility]  [phone]   [facility]  [phone]   Residential & Outpatient Substance Abuse Program  [phone]   Psychological Services Organization         Address  Phone  Notes  [facility]  [phone]- [phone]   [facility]  [phone]- [phone]   [facility] [address], [place] [phone] or [phone]    Mobile Crisis Teams Organization         Address  Phone  Notes  [facility], Mobile Crisis Care Unit  [phone]   [facility]  [address] [place], [place] [phone]   Doristine LocksSharon DeEsch [address] [place] [phone]    Self-Help/Support Groups Organization         Address  Phone  Notes  [facility] [place] - variety of support groups  336- I7437963 Call for more information  Narcotics Anonymous (NA), [facility]aring [facility] [address], [employer] [place]  2 meetings at this location   Statistician         Address  Phone  Notes  [facility] [address],    [place]  [phone]   [facility]  [address], [place] [address], [place], [place] [phone]   [facility] [address] [place], [place] [phone] Admissions: 8am-3pm M-F  [facility] [address],    [place], [place] [phone]   [facility] [address] [place], [place], [place] [phone]   [facility] [address]  [place], [place] [phone]   [facility] - Intensive Outpatient [address] Laurell Josephs [address], [place], [place] [phone]   [facility] ([facility] [address] [place].,  [place], [place] [phone] or [phone]   [facility] ([facility]) [address], [place], [place] [phone] Accepts Medicaid  [facility] [address],  [place] [phone] Substance Abuse/Addiction Treatment   [facility] Organization         Address  Phone  Notes  [facility]  [phone]   Angie Fava, PhD [address] Ervin Knack [place], [place]   [phone] or [phone]   [facility]   [address] [place], [place] [phone]   [facility] [address] [place], [place] [phone] Insurance/Medicaid/sponsorship through [facility] and Families [address]                                    [place], [place] [phone] Therapy/tele-psych/case  [facility] [address], [place] [phone]     Dr. Lolly Mustache  [phone]   [facility]  [employer] [facility] Dept. 1) [address], [place] 2) [address] [place] 3)  [address], [place] [phone]  [phone]   [facility] Child Abuse Hotline (928)798-9541 or [phone] (After Hours)

## 2013-11-21 NOTE — ED Notes (Signed)
He states he has a "rash at my privates I think may be genital warts".  He is healthy-looking and in no distress.

## 2013-11-21 NOTE — ED Notes (Signed)
PA at bedside.  Bumping rash present to testicles and penis. Pt reports it does not hurt or itch.

## 2013-11-21 NOTE — ED Notes (Signed)
Pt ambulatory upon DC. Pt was advised to follow up with dermatology as soon as possible for further evaluation. Pt verbalizes the understanding to follow up.

## 2013-11-27 ENCOUNTER — Encounter (HOSPITAL_COMMUNITY): Payer: Self-pay

## 2013-11-27 ENCOUNTER — Emergency Department (HOSPITAL_COMMUNITY)
Admission: EM | Admit: 2013-11-27 | Discharge: 2013-11-27 | Disposition: A | Discharge status: Home / Self Care | Payer: Managed Care, Other (non HMO) | Attending: Emergency Medicine | Admitting: Emergency Medicine

## 2013-11-27 DIAGNOSIS — Z87891 Personal history of nicotine dependence: Secondary | ICD-10-CM | POA: Insufficient documentation

## 2013-11-27 DIAGNOSIS — A630 Anogenital (venereal) warts: Secondary | ICD-10-CM | POA: Insufficient documentation

## 2013-11-27 DIAGNOSIS — R21 Rash and other nonspecific skin eruption: Secondary | ICD-10-CM | POA: Insufficient documentation

## 2013-11-27 HISTORY — DX: Anogenital (venereal) warts: A63.0

## 2013-11-27 NOTE — ED Notes (Signed)
Pt presents with NAD- [facility] Pt has been treated for this. Pt feels he has something other than this DX. MD did not see "blister" at that exam. Pt has no other concerns. Pt attempted to "pop" blister. "Yellow fluid came out" Denies N/V/D. Pt c/o of fever this week

## 2013-11-27 NOTE — Discharge Instructions (Signed)
Rash A rash is a change in the color or feel of your skin. There are many different types of rashes. You may have other problems along with your rash. HOME CARE  Avoid the thing that caused your rash.  Do not scratch your rash.  You may take cools baths to help stop itching.  Only take medicines as told by your doctor.  Keep all doctor visits as told. GET HELP RIGHT AWAY IF:   Your pain, puffiness (swelling), or redness gets worse.  You have a fever.  You have new or severe problems.  You have body aches, watery poop (diarrhea), or you throw up (vomit).  Your rash is not better after 3 days. MAKE SURE YOU:   Understand these instructions.  Will watch your condition.  Will get help right away if you are not doing well or get worse. Document Released: 06/12/2007 Document Revised: 03/18/2011 Document Reviewed: 10/08/2010 ExitCare Patient Information 2015 ExitCare, LLC. This information is not intended to replace advice given to you by your health care provider. Make sure you discuss any questions you have with your health care provider.  

## 2013-11-27 NOTE — ED Notes (Signed)
MD at bedside. EDPA VRINDA PRESENT 

## 2013-11-27 NOTE — ED Provider Notes (Signed)
CSN: [id number]     Arrival date & time 11/27/13  1133 History   First MD Initiated Contact with Patient 11/27/13 1147     Chief Complaint  Patient presents with  . Rash     (Consider location/radiation/quality/duration/timing/severity/associated sxs/prior Treatment) HPI Comments: Pt comes in with complaint of a bump on his penis. Pt states that he was recently treated for warts. Denies pain states that he picked at the area this morning and it may has had a white top. No burning with urination or discharge. Pt concerned about herpes  The history is provided by the patient. No language interpreter was used.    Past Medical History  Diagnosis Date  . Genital warts    Past Surgical History  Procedure Laterality Date  . Appendectomy     No family history on file. History  Substance Use Topics  . Smoking status: Former Smoker -- 1.00 packs/day for 5 years    Types: Cigarettes  . Smokeless tobacco: Not on file  . Alcohol Use: Yes     Comment: occ    Review of Systems  All other systems reviewed and are negative.     Allergies  Review of patient's allergies indicates no known allergies.  Home Medications   Prior to Admission medications   Not on File   BP 140/58 mmHg  Pulse 65  Temp(Src) 98.4 F (36.9 C) (Oral)  Resp 17  Ht 6' (1.829 m)  Wt 150 lb (68.04 kg)  BMI 20.34 kg/m2  SpO2 100% Physical Exam  Constitutional: He is oriented to person, place, and time. He appears well-developed and well-nourished.  Cardiovascular: Normal rate and regular rhythm.   Pulmonary/Chest: Effort normal and breath sounds normal.  Genitourinary:  Warts noted. No other sores noted at this time  Musculoskeletal: Normal range of motion.  Neurological: He is alert and oriented to person, place, and time.  Skin: Skin is warm and dry.  Nursing note reviewed.   ED Course  Procedures (including critical care time) Labs Review Labs Reviewed - No data to display  Imaging  Review No results found.   EKG Interpretation None      MDM   Final diagnoses:  Groin rash    No sign of herpes at this time. Discussed safe sex practices with pt    Teressa LowerVrinda Savannha Welle, NP 11/27/13 1243  Gerhard Munchobert Lockwood, MD 11/27/13 [phone]

## 2013-12-13 ENCOUNTER — Ambulatory Visit: Payer: Managed Care, Other (non HMO) | Admitting: Medical

## 2013-12-13 ENCOUNTER — Telehealth: Payer: Self-pay | Admitting: *Deleted

## 2013-12-13 NOTE — Telephone Encounter (Signed)
Pt did not show for appointment 12/13/2013 at 3:00pm to establish care 

## 2014-05-20 ENCOUNTER — Encounter (HOSPITAL_COMMUNITY): Payer: Self-pay | Admitting: Emergency Medicine

## 2014-05-20 ENCOUNTER — Emergency Department (HOSPITAL_COMMUNITY)
Admission: EM | Admit: 2014-05-20 | Discharge: 2014-05-21 | Disposition: A | Discharge status: Home / Self Care | Payer: PPO | Attending: Emergency Medicine | Admitting: Emergency Medicine

## 2014-05-20 DIAGNOSIS — Z87891 Personal history of nicotine dependence: Secondary | ICD-10-CM | POA: Diagnosis not present

## 2014-05-20 DIAGNOSIS — Z8619 Personal history of other infectious and parasitic diseases: Secondary | ICD-10-CM | POA: Diagnosis not present

## 2014-05-20 DIAGNOSIS — M654 Radial styloid tenosynovitis [de Quervain]: Secondary | ICD-10-CM | POA: Diagnosis not present

## 2014-05-20 DIAGNOSIS — R200 Anesthesia of skin: Secondary | ICD-10-CM | POA: Diagnosis present

## 2014-05-20 MED ORDER — NAPROXEN 375 MG PO TABS
375.0000 mg | ORAL_TABLET | Freq: Two times a day (BID) | ORAL | Status: DC
Start: 2014-05-20 — End: 2014-06-20

## 2014-05-20 MED ORDER — IBUPROFEN 800 MG PO TABS
800.0000 mg | ORAL_TABLET | Freq: Once | ORAL | Status: AC
  Administered 2014-05-20: 800 mg via ORAL
  Filled 2014-05-20: qty 1

## 2014-05-20 NOTE — ED Notes (Signed)
Pt c/o arm (bilateral) numbness and leg (bilateral) tingling starting around 2-3 weeks ago. Says, "when I went to hold a cup yesterday I felt some pressure (pointing to his left forearm). I feel random pressure in my legs and arms." Denies arm or leg injury. Denies facial numbness. Patient appears to have full ROM in all extremities. No other c/c.

## 2014-05-20 NOTE — ED Provider Notes (Signed)
CSN: [id number]     Arrival date & time 05/20/14  2216 History   First MD Initiated Contact with Patient 05/20/14 2304     Chief Complaint  Patient presents with  . Numbness    arms, legs     (Consider location/radiation/quality/duration/timing/severity/associated sxs/prior Treatment) Patient is a 24 y.o. male presenting with extremity pain. The history is provided by the patient.  Extremity Pain This is a new problem. The current episode started yesterday. The problem occurs constantly. The problem has not changed since onset.Pertinent negatives include no chest pain, no abdominal pain, no headaches and no shortness of breath. Exacerbated by: holding heavy objects in the left hand. Nothing relieves the symptoms. He has tried nothing for the symptoms. The treatment provided no relief.  Denies other extremities.  Feeling pressure and pain in the left forearm and wrist after [place].  No weakness nor numbness no changes in vision or speech. No f/c/r.  No HA.  No trauma  Past Medical History  Diagnosis Date  . Genital warts    Past Surgical History  Procedure Laterality Date  . Appendectomy     History reviewed. No pertinent family history. History  Substance Use Topics  . Smoking status: Former Smoker -- 1.00 packs/day for 5 years    Types: Cigarettes  . Smokeless tobacco: Not on file  . Alcohol Use: Yes     Comment: occ    Review of Systems  Constitutional: Negative for fever.  Eyes: Negative for photophobia and visual disturbance.  Respiratory: Negative for shortness of breath.   Cardiovascular: Negative for chest pain.  Gastrointestinal: Negative for abdominal pain.  Neurological: Negative for dizziness, seizures, syncope, facial asymmetry, speech difficulty, weakness, light-headedness, numbness and headaches.  Hematological: Negative for adenopathy.  All other systems reviewed and are negative.     Allergies  Review of  patient's allergies indicates no known allergies.  Home Medications   Prior to Admission medications   Not on File   BP 151/81 mmHg  Pulse 88  Temp(Src) 98.2 F (36.8 C) (Oral)  Resp 20  SpO2 99% Physical Exam  Constitutional: He is oriented to person, place, and time. He appears well-developed and well-nourished. No distress.  HENT:  Head: Normocephalic and atraumatic.  Mouth/Throat: Oropharynx is clear and moist. No oropharyngeal exudate.  Eyes: Conjunctivae and EOM are normal. Pupils are equal, round, and reactive to light.  Neck: Normal range of motion. Neck supple.  Cardiovascular: Normal rate, regular rhythm and intact distal pulses.   Pulmonary/Chest: Effort normal and breath sounds normal. No respiratory distress. He has no wheezes. He has no rales.  Abdominal: Soft. Bowel sounds are normal. There is no tenderness. There is no rebound and no guarding.  Musculoskeletal: Normal range of motion. He exhibits no edema or tenderness.       Left elbow: Normal.       Left wrist: He exhibits normal range of motion, no tenderness, no bony tenderness, no swelling, no effusion, no crepitus, no deformity and no laceration.       Left forearm: He exhibits no tenderness, no bony tenderness, no swelling, no edema, no deformity and no laceration.  No snuff box tenderness.  No tinel or phalen    Neurological: He is alert and oriented to person, place, and time. He has normal reflexes. He displays normal reflexes. No cranial nerve deficit. Coordination normal.  Skin: Skin is warm and dry.  Psychiatric: He has a normal  mood and affect.    ED Course  Procedures (including critical care time) Labs Review Labs Reviewed - No data to display  Imaging Review No results found.   EKG Interpretation None      MDM   Final diagnoses:  None    Appreciate nurses note but patient only speaks about left forearm and wrist.  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  Ice elevation NSAIDs and no heavy lifting prn follow up with ortho    Linsy Ehresman, MD 05/20/14 2353

## 2014-06-19 ENCOUNTER — Encounter (HOSPITAL_COMMUNITY): Payer: Self-pay

## 2014-06-19 ENCOUNTER — Emergency Department (HOSPITAL_COMMUNITY)
Admission: EM | Admit: 2014-06-19 | Discharge: 2014-06-20 | Disposition: A | Discharge status: Home / Self Care | Payer: PPO | Attending: Emergency Medicine | Admitting: Emergency Medicine

## 2014-06-19 DIAGNOSIS — Z87891 Personal history of nicotine dependence: Secondary | ICD-10-CM | POA: Diagnosis not present

## 2014-06-19 DIAGNOSIS — N508 Other specified disorders of male genital organs: Secondary | ICD-10-CM | POA: Diagnosis not present

## 2014-06-19 DIAGNOSIS — Z8619 Personal history of other infectious and parasitic diseases: Secondary | ICD-10-CM | POA: Diagnosis not present

## 2014-06-19 DIAGNOSIS — IMO0002 Reserved for concepts with insufficient information to code with codable children: Secondary | ICD-10-CM

## 2014-06-19 NOTE — ED Notes (Signed)
Pt complains of groin pain for several weeks, mainly left testicle pain

## 2014-06-20 LAB — URINALYSIS, ROUTINE W REFLEX MICROSCOPIC
Bilirubin Urine: NEGATIVE
Glucose, UA: NEGATIVE mg/dL
Hgb urine dipstick: NEGATIVE
Ketones, ur: NEGATIVE mg/dL
LEUKOCYTES UA: NEGATIVE
NITRITE: NEGATIVE
PH: 7 (ref 5.0–8.0)
Protein, ur: NEGATIVE mg/dL
SPECIFIC GRAVITY, URINE: 1.007 (ref 1.005–1.030)
Urobilinogen, UA: 0.2 mg/dL (ref 0.0–1.0)

## 2014-06-20 MED ORDER — NAPROXEN 500 MG PO TABS: 500.0000 mg | ORAL_TABLET | Freq: Two times a day (BID) | ORAL | Status: AC

## 2014-06-20 NOTE — ED Provider Notes (Signed)
CSN: [id number]     Arrival date & time 06/19/14  2329 History   First MD Initiated Contact with Patient 06/19/14 2343     Chief Complaint  Patient presents with  . Testicle Pain     (Consider location/radiation/quality/duration/timing/severity/associated sxs/prior Treatment) HPI Comments: Dennis Bishop is a 23y/o male with a 59m h/o intermittent 3/10 left testicle pain that has worsened in frequency over the past 2wks, and today he began having pain in the right testicle.  He describes the pain as heavy and dull and lasting 5 min per episode, currently he is having at least 10 episodes of pain a day.  He was last sexually active approximately 7 months ago and reports to using condoms in those encounters, he denies frequent masturbation, any rashes, or penile discharge.  No recent intercourse, so he is unsure if there is any pain with intercourse/ejaculation. He reports having difficulty completely emptying his bladder when he urinates and denies seeing blood in his urine or dysuria.  He has not tried any medications to make his pain better. Pt has no rectal pain.    ROS 10 Systems reviewed and are negative for acute change except as noted in the HPI.      The history is provided by the patient.    Past Medical History  Diagnosis Date  . Genital warts    Past Surgical History  Procedure Laterality Date  . Appendectomy     History reviewed. No pertinent family history. History  Substance Use Topics  . Smoking status: Former Smoker -- 1.00 packs/day for 5 years    Types: Cigarettes  . Smokeless tobacco: Not on file  . Alcohol Use: Yes     Comment: occ    Review of Systems  All other systems reviewed and are negative.     Allergies  Peanut-containing drug products  Home Medications   Prior to Admission medications   Medication Sig Start Date End Date Taking? Authorizing Provider  naproxen (NAPROSYN) 375 MG tablet Take 1 tablet (375 mg total) by mouth 2 (two) times  daily. Patient not taking: Reported on 06/19/2014 05/20/14   April Palumbo, MD   BP 134/53 mmHg  Pulse 74  Temp(Src) 98.5 F (36.9 C) (Oral)  Resp 18  Ht 6' (1.829 m)  Wt 155 lb (70.308 kg)  BMI 21.02 kg/m2  SpO2 99% Physical Exam  Constitutional: He is oriented to person, place, and time. He appears well-developed.  HENT:  Head: Normocephalic and atraumatic.  Eyes: Conjunctivae and EOM are normal. Pupils are equal, round, and reactive to light.  Neck: Normal range of motion. Neck supple.  Cardiovascular: Normal rate and regular rhythm.   Pulmonary/Chest: Effort normal and breath sounds normal.  Abdominal: Soft. Bowel sounds are normal. He exhibits no distension. There is no tenderness. There is no rebound and no guarding.  Genitourinary:  Pt has no rash on his genitalia. There is no scrotal swelling, erythema, induration. No hernia appreciated. No testicular lesions appreciated. Testes are free moving. No gross fluid on scrotal exam.  Neurological: He is alert and oriented to person, place, and time.  Skin: Skin is warm.  Nursing note and vitals reviewed.   ED Course  Procedures (including critical care time) Labs Review Labs Reviewed - No data to display  Imaging Review No results found.   EKG Interpretation None      MDM   Final diagnoses:  None    24 y/o with testicle pain x 6 months, worsening over the  past 2 weeks - frequency and pain. No uti like sx. No penile discharge. No trauma. Pt has non tender, and benign GU exam. Clinical concerns extremely low for torsion, epididymitis, hernia. Unsure what the etiology for pain is -suspect some inflammatory or vascular pathology, but certainly nothing acute. I am not sure what to make of the urinary retention as well.  Pt has no PCP, so he has been asked to take motrin round the clock for the next week and then prn. If the symptoms persist, then patient will f/u with urology - f/u info provided.    Derwood Kaplan, MD 06/20/14 [phone]

## 2014-06-20 NOTE — Discharge Instructions (Signed)
We saw you in the ER for the scrotal pain.  We are not sure what is causing your symptoms. The workup in the ER is not complete, and is limited to screening for life threatening and emergent conditions only, so please see a primary care doctor for further evaluation.  Take naprosyn as prescribed.  See the urologist as requested.   Testicular Self-Exam A self-examination of your testicles involves looking at and feeling your testicles for abnormal lumps or swelling. Several things can cause swelling, lumps, or pain in your testicles. Some of these causes are:  Injuries.  Inflammation.  Infection.  Accumulation of fluids around your testicle (hydrocele).  Twisted testicles (testicular torsion).  Testicular cancer. Self-examination of the testicles and groin areas may be advised if you are at risk for testicular cancer. Risks for testicular cancer include:  An undescended testicle (cryptorchidism).  A history of previous testicular cancer.  A family history of testicular cancer. The testicles are easiest to examine after warm baths or showers and are more difficult to examine when you are cold. This is because the muscles attached to the testicles retract and pull them up higher or into the abdomen. Follow these steps while you are standing:  Hold your penis away from your body.  Roll one testicle between your thumb and forefinger, feeling the entire testicle.  Roll the other testicle between your thumb and forefinger, feeling the entire testicle. Feel for lumps, swelling, or discomfort. A normal testicle is egg shaped and feels firm. It is smooth and not tender. The spermatic cord can be felt as a firm spaghetti-like cord at the back of your testicle. It is also important to examine the crease between the front of your leg and your abdomen. Feel for any bumps that are tender. These could be enlarged lymph nodes.  Document Released: 04/01/2000 Document Revised: 08/26/2012  Document Reviewed: 06/15/2012 [facility] Patient Information 2015 [place], [place]. This information is not intended to replace advice given to you by your health care provider. Make sure you discuss any questions you have with your health care provider.  Scrotal Swelling Scrotal swelling may occur on one or both sides of the scrotum. Pain may also occur with swelling. Possible causes of scrotal swelling include:   Injury.  Infection.  An ingrown hair or abrasion in the area.  Repeated rubbing from tight-fitting underwear.  Poor hygiene.  A weakened area in the muscles around the groin (hernia). A hernia can allow abdominal contents to push into the scrotum.  Fluid around the testicle (hydrocele).  Enlarged vein around the testicle (varicocele).  Certain medical treatments or existing conditions.  A recent genital surgery or procedure.  The spermatic cord becomes twisted in the scrotum, which cuts off blood supply (testicular torsion).  Testicular cancer. HOME CARE INSTRUCTIONS Once the cause of your scrotal swelling has been determined, you may be asked to monitor your scrotum for any changes. The following actions may help to alleviate any discomfort you are experiencing:  Rest and limit activity until the swelling goes away. Lying down is the preferred position.  Put ice on the scrotum:  Put ice in a plastic bag.  Place a towel between your skin and the bag.  Leave the ice on for 20 minutes, 2-3 times a day for 1-2 days.  Place a rolled towel under the testicles for support.  Wear loose-fitting clothing or an athletic support cup for comfort.  Take all medicines as directed by your health care provider.  Perform  a monthly self-exam of the scrotum and penis. Feel for changes. Ask your health care provider how to perform a monthly self-exam if you are unsure. SEEK MEDICAL CARE IF:  You have a sudden (acute) onset of pain that is persistent and not improving.  You  notice a heavy feeling or fluid in the scrotum.  You have pain or burning while urinating.  You have blood in the urine or semen.  You feel a lump around the testicle.  You notice that one testicle is larger than the other (slight variation is normal).  You have a persistent dull ache or pain in the groin or scrotum. SEEK IMMEDIATE MEDICAL CARE IF:  The pain does not go away or becomes severe.  You have a fever or shaking chills.  You have pain or vomiting that cannot be controlled.  You notice significant redness or swelling of one or both sides of the scrotum.  You experience redness spreading upward from your scrotum to your abdomen or downward from your scrotum to your thighs. MAKE SURE YOU:  Understand these instructions.  Will watch your condition.  Will get help right away if you are not doing well or get worse. Document Released: 01/26/2010 Document Revised: 08/26/2012 Document Reviewed: 05/28/2012 [facility] Patient Information 2015 [place], [place]. This information is not intended to replace advice given to you by your health care provider. Make sure you discuss any questions you have with your health care provider.

## 2014-06-21 LAB — URINE CULTURE
Colony Count: NO GROWTH
Culture: NO GROWTH

## 2014-06-23 ENCOUNTER — Encounter (HOSPITAL_COMMUNITY): Payer: Self-pay | Admitting: Emergency Medicine

## 2014-06-23 ENCOUNTER — Emergency Department (HOSPITAL_COMMUNITY)
Admission: EM | Admit: 2014-06-23 | Discharge: 2014-06-23 | Disposition: A | Discharge status: Home / Self Care | Payer: PPO | Attending: Emergency Medicine | Admitting: Emergency Medicine

## 2014-06-23 DIAGNOSIS — Z8619 Personal history of other infectious and parasitic diseases: Secondary | ICD-10-CM | POA: Insufficient documentation

## 2014-06-23 DIAGNOSIS — Z791 Long term (current) use of non-steroidal anti-inflammatories (NSAID): Secondary | ICD-10-CM | POA: Insufficient documentation

## 2014-06-23 DIAGNOSIS — N508 Other specified disorders of male genital organs: Secondary | ICD-10-CM | POA: Insufficient documentation

## 2014-06-23 DIAGNOSIS — G8929 Other chronic pain: Secondary | ICD-10-CM | POA: Diagnosis not present

## 2014-06-23 DIAGNOSIS — N50819 Testicular pain, unspecified: Secondary | ICD-10-CM

## 2014-06-23 DIAGNOSIS — Z87891 Personal history of nicotine dependence: Secondary | ICD-10-CM | POA: Diagnosis not present

## 2014-06-23 MED ORDER — HYDROCODONE-ACETAMINOPHEN 5-325 MG PO TABS: 1.0000 | ORAL_TABLET | Freq: Four times a day (QID) | ORAL | Status: AC | PRN

## 2014-06-23 NOTE — ED Provider Notes (Signed)
CSN: [id number]     Arrival date & time 06/23/14  1156 History  This chart was scribed for non-physician practitioner working with Tilden Fossa, MD by Murriel Hopper, ED Scribe. This patient was seen in room WTR5/WTR5 and the patient's care was started at 12:17 PM.    Chief Complaint  Patient presents with  . Testicle Pain     Patient is a 24 y.o. male presenting with testicular pain. The history is provided by the patient. No language interpreter was used.  Testicle Pain This is a chronic problem. The current episode started more than 1 month ago. The problem occurs constantly. The problem has been unchanged. Pertinent negatives include no abdominal pain, arthralgias, chest pain, chills, fever, myalgias, nausea, numbness, rash, urinary symptoms, vomiting or weakness. Exacerbated by: laying down. He has tried NSAIDs for the symptoms. The treatment provided no relief.     HPI Comments: Patrick Salemi is a 24 y.o. male with a PMHx of genital warts, who presents to the Emergency Department complaining of constant, sharp, left testicle pain that he rates as 6/10 in severity with associated left-sided abdominal pain, and urinary hesitancy that has been present x6 months. Pt states that he has had this pain for 6 months intermittently but it has become more constant. Pt states that his pain worsens with laying down and has tried applying heat to the area with no relief. Pt reports that he took Naproxen after his last visit with little relief. Pt denies painful ejaculation, painful intercourse, rashes, fevers, chills, chest pain, SOB, nausea, vomiting, diarrhea, constipation, hematuria, dysuria, penile discharge, testicle swelling, numbness, tingling, or weakness.   Has an appt with urology tomorrow at 2pm.     Past Medical History  Diagnosis Date  . Genital warts    Past Surgical History  Procedure Laterality Date  . Appendectomy     No family history on file. History  Substance Use  Topics  . Smoking status: Former Smoker -- 1.00 packs/day for 5 years    Types: Cigarettes  . Smokeless tobacco: Not on file  . Alcohol Use: Yes     Comment: occ    Review of Systems  Constitutional: Negative for fever and chills.  Respiratory: Negative for shortness of breath.   Cardiovascular: Negative for chest pain.  Gastrointestinal: Negative for nausea, vomiting, abdominal pain, diarrhea and constipation.  Genitourinary: Positive for testicular pain. Negative for dysuria, frequency, hematuria, discharge and scrotal swelling.  Musculoskeletal: Negative for myalgias and arthralgias.  Skin: Negative for rash.  Allergic/Immunologic: Negative for immunocompromised state.  Neurological: Negative for weakness and numbness.  Psychiatric/Behavioral: Negative for confusion.   10 Systems reviewed and all are negative for acute change except as noted in the HPI.    Allergies  Peanut-containing drug products  Home Medications   Prior to Admission medications   Medication Sig Start Date End Date Taking? Authorizing Provider  naproxen (NAPROSYN) 500 MG tablet Take 1 tablet (500 mg total) by mouth 2 (two) times daily. 06/20/14   Ankit Nanavati, MD   BP 136/58 mmHg  Pulse 70  Temp(Src) 97.5 F (36.4 C) (Oral)  Resp 20  SpO2 100% Physical Exam  Constitutional: He is oriented to person, place, and time. Vital signs are normal. He appears well-developed and well-nourished.  Non-toxic appearance. No distress.  Afebrile, nontoxic, NAD  HENT:  Head: Normocephalic and atraumatic.  Mouth/Throat: Oropharynx is clear and moist and mucous membranes are normal.  Eyes: Conjunctivae and EOM are normal. Right eye exhibits no  discharge. Left eye exhibits no discharge.  Neck: Normal range of motion. Neck supple.  Cardiovascular: Normal rate, regular rhythm, normal heart sounds and intact distal pulses.  Exam reveals no gallop and no friction rub.   No murmur heard. Pulmonary/Chest: Effort normal  and breath sounds normal. No respiratory distress. He has no decreased breath sounds. He has no wheezes. He has no rhonchi. He has no rales.  Abdominal: Soft. Normal appearance and bowel sounds are normal. He exhibits no distension. There is no tenderness. There is no rigidity, no rebound, no guarding, no CVA tenderness, no tenderness at McBurney's point and negative Murphy's sign.  Soft, NTND, +BS throughout, no r/g/r, neg murphy's, neg mcburney's, no CVA TTP   Genitourinary:  Deferred due to recent testicular exam  Musculoskeletal: Normal range of motion.  Neurological: He is alert and oriented to person, place, and time. He has normal strength. No sensory deficit.  Skin: Skin is warm, dry and intact. No rash noted.  Psychiatric: He has a normal mood and affect.  Nursing note and vitals reviewed.   ED Course  Procedures (including critical care time)  DIAGNOSTIC STUDIES: Oxygen Saturation is 100% on room air, normal by my interpretation.    COORDINATION OF CARE: 12:23 PM Discussed treatment plan with pt at bedside and pt agreed to plan.   Labs Review Labs Reviewed - No data to display  Imaging Review No results found.   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 male here with chronic bilateral testicular pain 6 months. He was seen on 6/12 with a benign GU exam at that time, no concern for torsion, epididymitis, or other GU emergency. UA at that time was clear. He has no change in his symptoms, and he has a urology appointment tomorrow at 2 PM. Deferred testicle exam due to recent evaluation without change in symptoms. No abdominal tenderness. Discussed use of ibuprofen as instructed by the previous provider, and will give Norco as needed for severe pain. Discussed follow-up with urology tomorrow at his artery scheduled appointment. Doubt need for further work up. I explained the diagnosis and have given explicit precautions to  return to the ER including for any other new or worsening symptoms. The patient understands and accepts the medical plan as it's been dictated and I have answered their questions. Discharge instructions concerning home care and prescriptions have been given. The patient is STABLE and is discharged to home in good condition.   I personally performed the services described in this documentation, which was scribed in my presence. The recorded information has been reviewed and is accurate.  BP 136/58 mmHg  Pulse 70  Temp(Src) 97.5 F (36.4 C) (Oral)  Resp 20  SpO2 100%  Meds ordered this encounter  Medications  . HYDROcodone-acetaminophen (NORCO) 5-325 MG per tablet    Sig: Take 1 tablet by mouth every 6 (six) hours as needed for severe pain.    Dispense:  6 tablet    Refill:  0    Order Specific Question:  Supervising Provider    Answer:  Eber Hong [[id number]]      Glover Capano Camprubi-Soms, PA-C 06/23/14 1246  Tilden Fossa, MD 06/23/14 1422

## 2014-06-23 NOTE — Discharge Instructions (Signed)
You will need to follow up with the urologist tomorrow for ongoing management of your chronic testicle pain. Use ibuprofen 3 times daily, and take norco as directed as needed for worsening pain. Return to the ER for changes or worsening symptoms.   Testicular Self-Exam A self-examination of your testicles involves looking at and feeling your testicles for abnormal lumps or swelling. Several things can cause swelling, lumps, or pain in your testicles. Some of these causes are:  Injuries.  Inflammation.  Infection.  Accumulation of fluids around your testicle (hydrocele).  Twisted testicles (testicular torsion).  Testicular cancer. Self-examination of the testicles and groin areas may be advised if you are at risk for testicular cancer. Risks for testicular cancer include:  An undescended testicle (cryptorchidism).  A history of previous testicular cancer.  A family history of testicular cancer. The testicles are easiest to examine after warm baths or showers and are more difficult to examine when you are cold. This is because the muscles attached to the testicles retract and pull them up higher or into the abdomen. Follow these steps while you are standing:  Hold your penis away from your body.  Roll one testicle between your thumb and forefinger, feeling the entire testicle.  Roll the other testicle between your thumb and forefinger, feeling the entire testicle. Feel for lumps, swelling, or discomfort. A normal testicle is egg shaped and feels firm. It is smooth and not tender. The spermatic cord can be felt as a firm spaghetti-like cord at the back of your testicle. It is also important to examine the crease between the front of your leg and your abdomen. Feel for any bumps that are tender. These could be enlarged lymph nodes.  Document Released: 04/01/2000 Document Revised: 08/26/2012 Document Reviewed: 06/15/2012 [facility] Patient Information 2015 [place], [place]. This information  is not intended to replace advice given to you by your health care provider. Make sure you discuss any questions you have with your health care provider.

## 2014-06-23 NOTE — ED Notes (Addendum)
Pt c/o bilateral testicular pain x 6 months. States that it switches between testicles but mainly the left. States he has been seen here for same.  Has an appt tomorrow with urology at 2 pm.

## 2014-07-16 IMAGING — CR DG ANKLE COMPLETE 3+V*R*
3 series · 3 of 3 positions shown · non-contrast
Comparison: None.

CLINICAL DATA: Pain post trauma

RIGHT ANKLE - COMPLETE 3+ VIEW

[x ankle ap right]
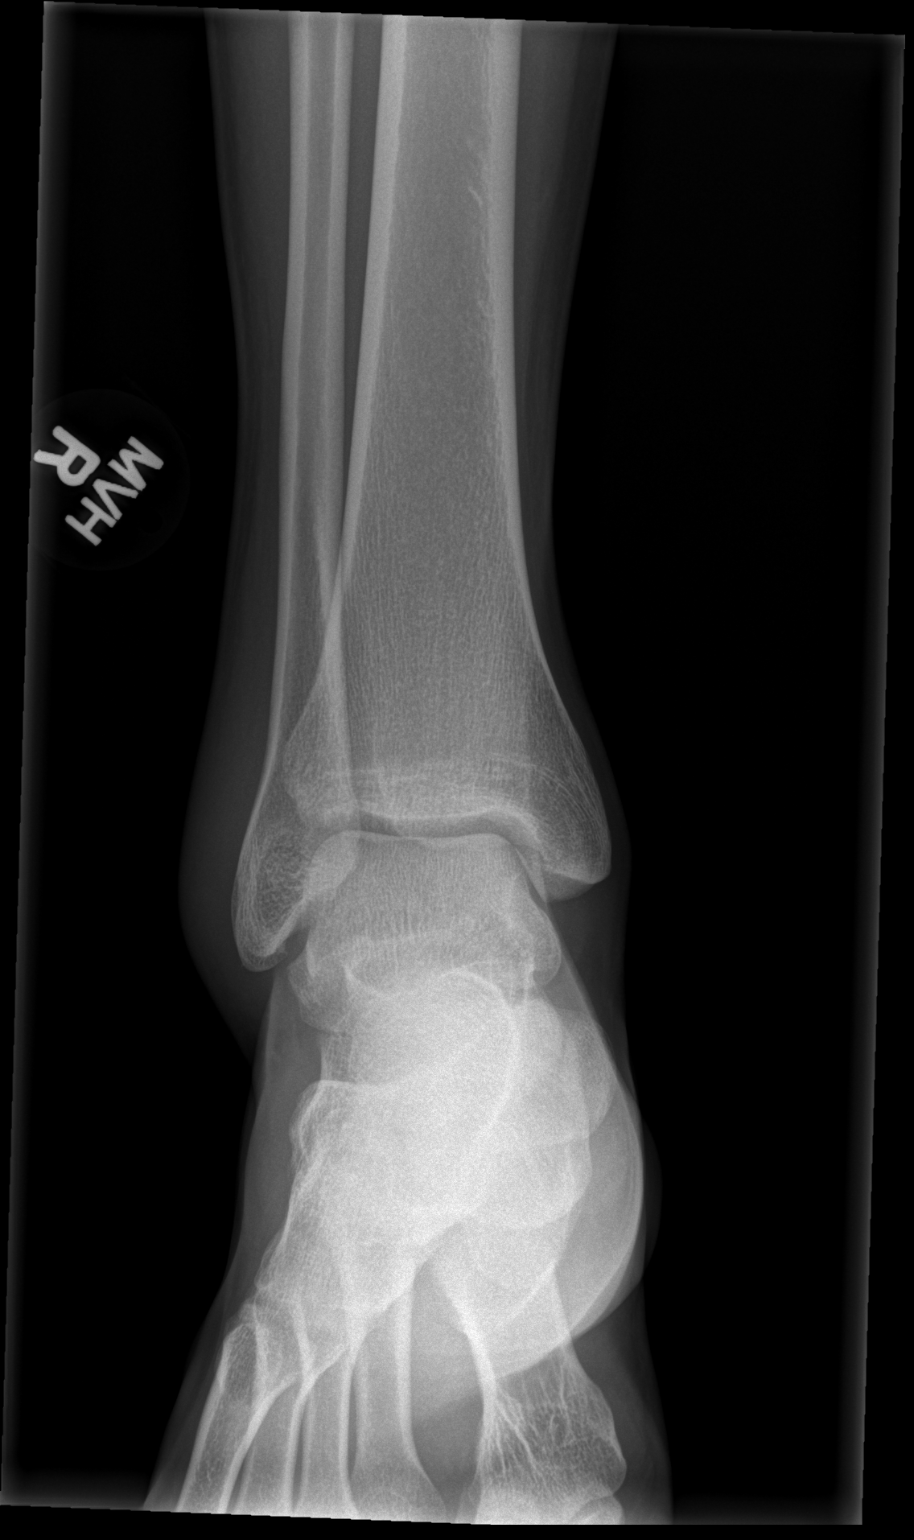

[x ankle obl right]
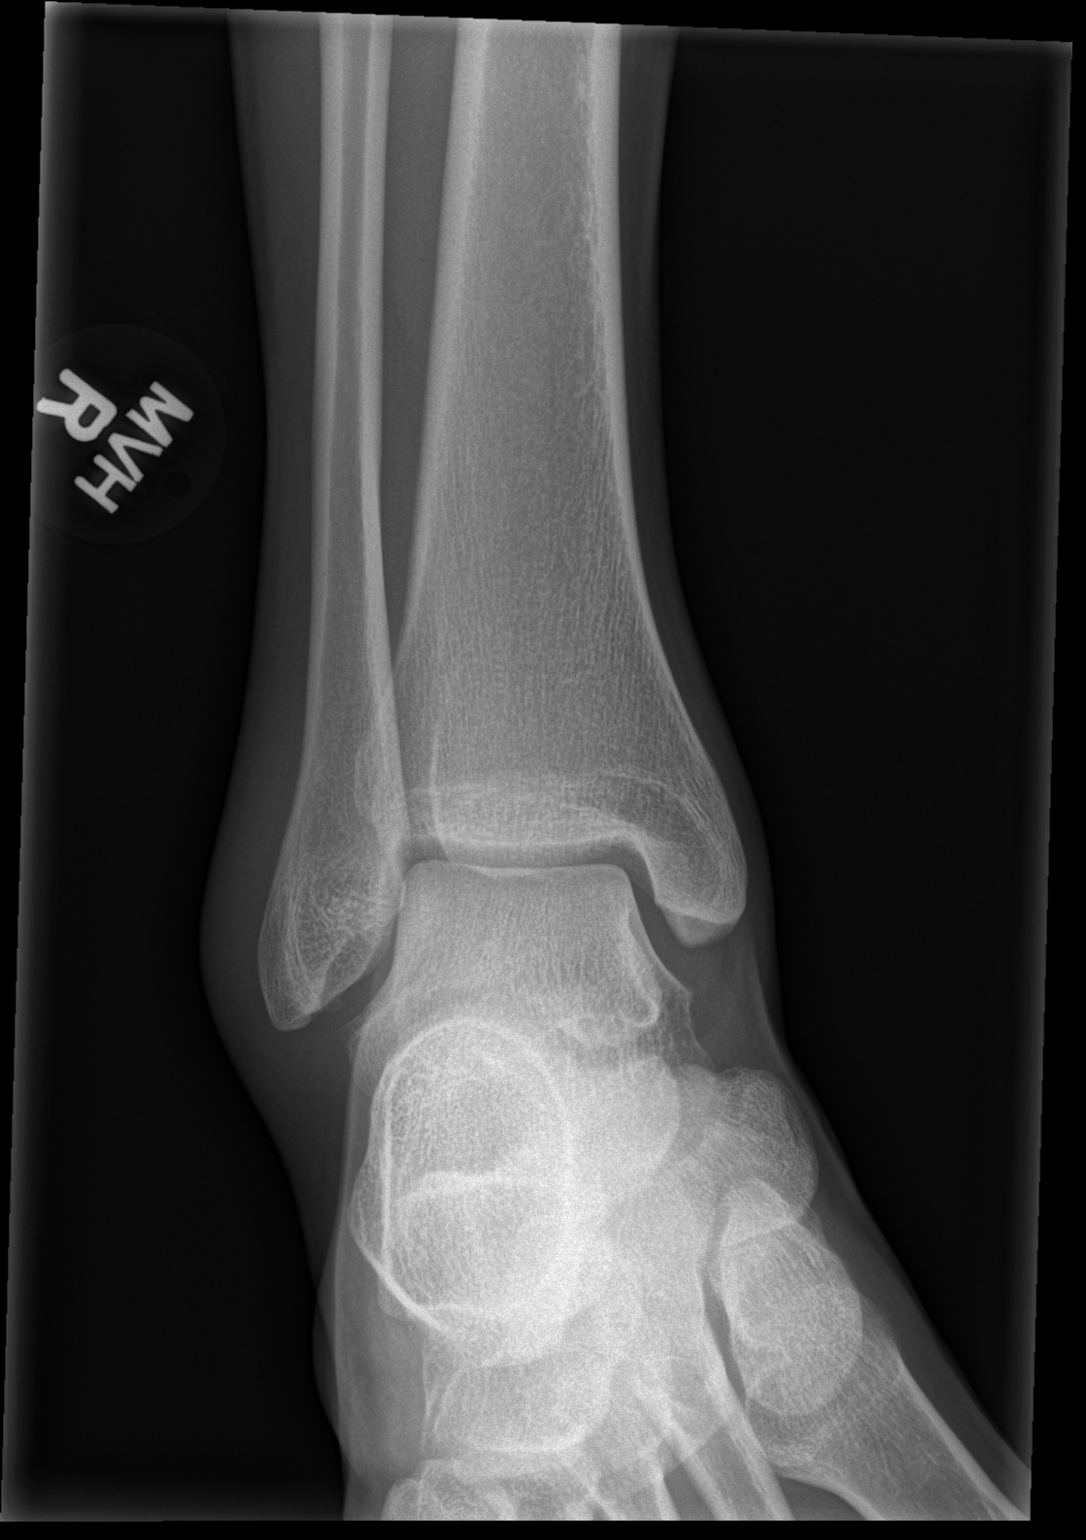

[x ankle lat right]
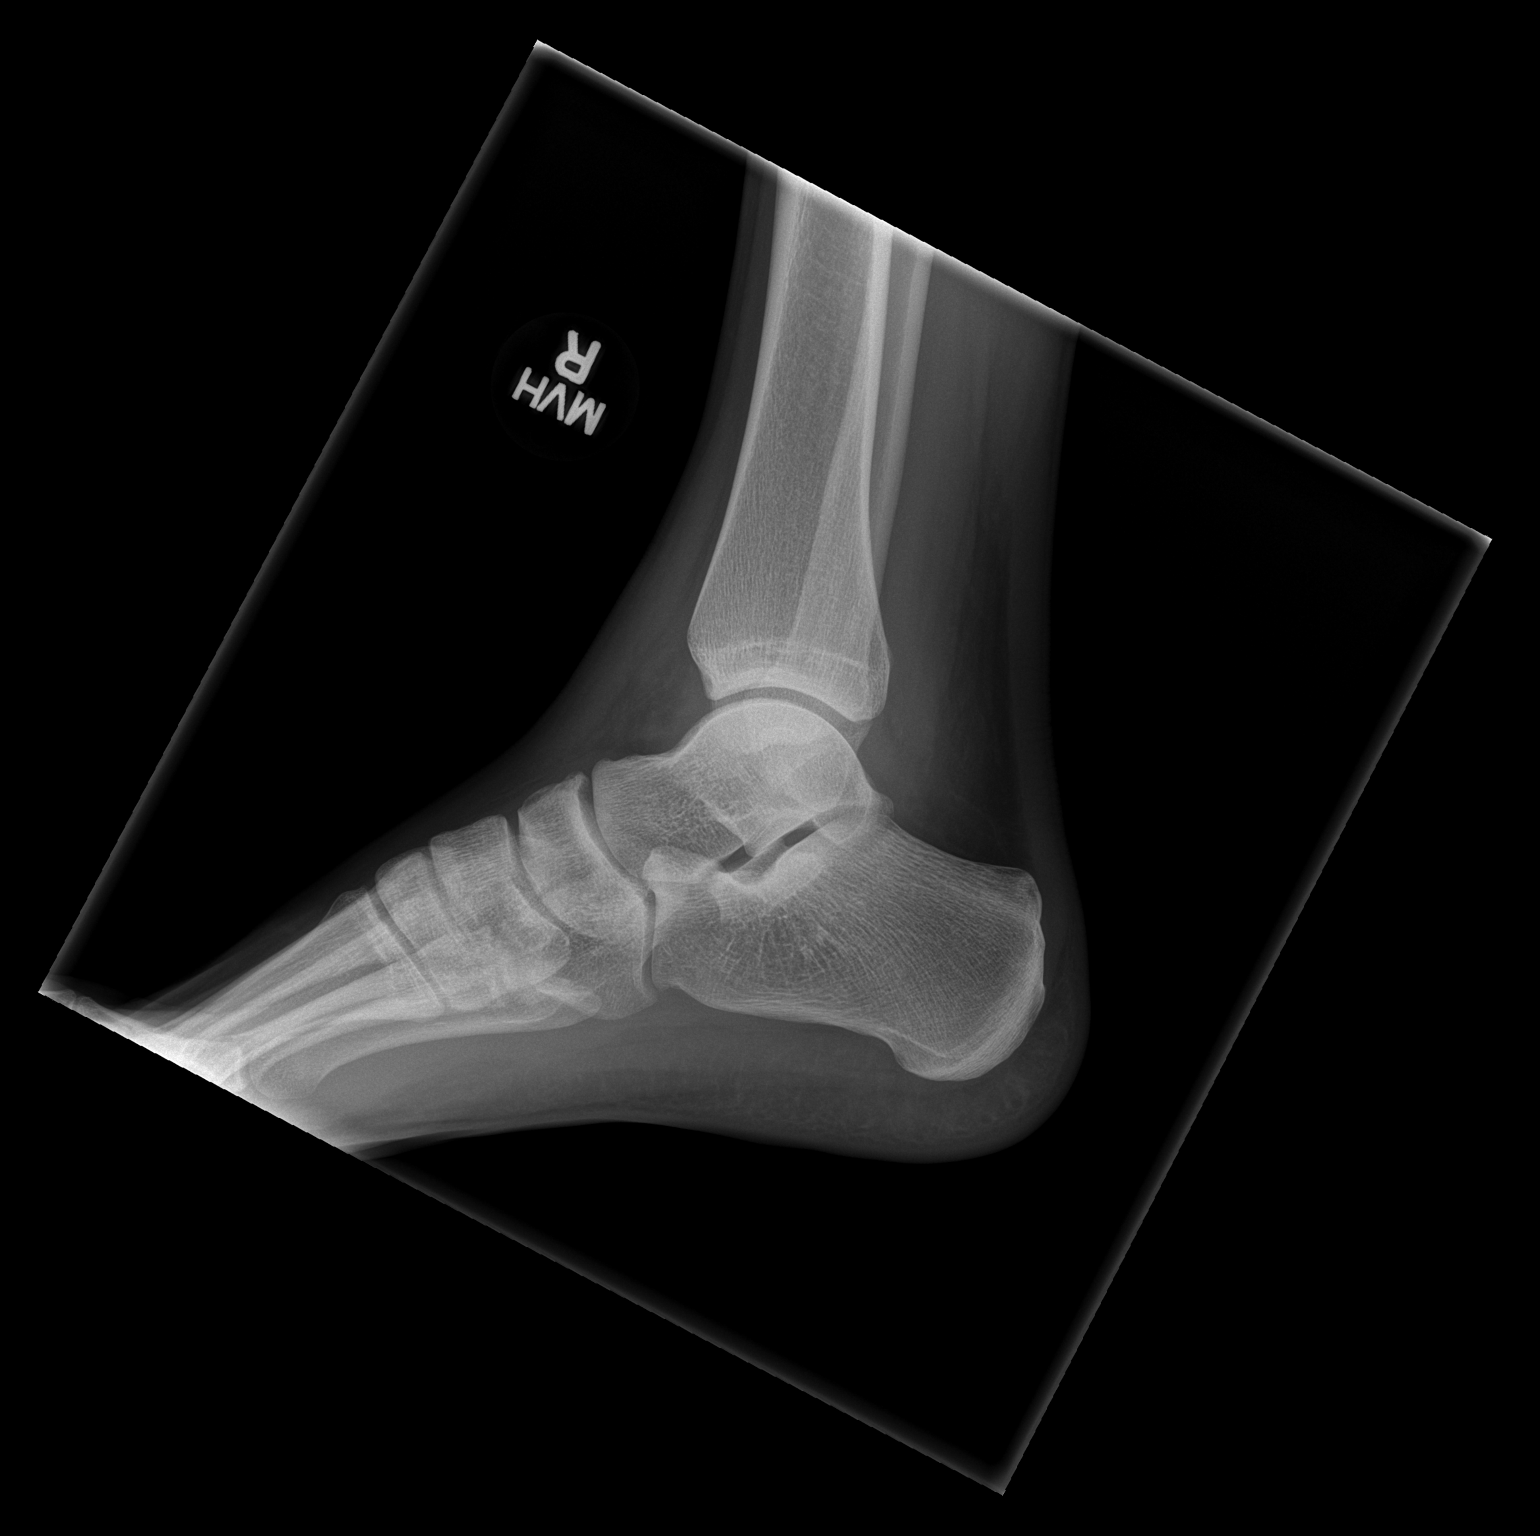

[3 of 3 positions shown; findings below may reference images not displayed]

FINDINGS: Frontal, oblique, and lateral views were obtained.  There
is soft tissue swelling laterally. There is no demonstrable
fracture.  There is a small joint effusion. Ankle mortise appears
intact.  No erosive change.
IMPRESSION: Swelling laterally with small effusion.  Suspect ligamentous
injury.  No fracture apparent.

## 2014-07-19 ENCOUNTER — Ambulatory Visit: Payer: Self-pay | Admitting: Physician Assistant

## 2014-07-21 ENCOUNTER — Telehealth: Payer: Self-pay | Admitting: Physician Assistant

## 2014-07-21 NOTE — Telephone Encounter (Signed)
Patient had an appointment scheduled on 07/19/2014 and he was a no show. Called patient to get further information. He states that he tried to cancel his appointment through the automated phone call reminder.   Dennis Bishop

## 2014-08-11 ENCOUNTER — Ambulatory Visit: Payer: Self-pay | Admitting: Physician Assistant
